# Patient Record
Sex: Male | Born: 1945 | Race: White | Hispanic: No | Marital: Married | Smoking: Current every day smoker
Health system: Southern US, Community
[De-identification: ages and names within clinical notes are randomized; demographics above are authoritative.]

## PROBLEM LIST (undated history)

## (undated) DIAGNOSIS — E079 Disorder of thyroid, unspecified: Secondary | ICD-10-CM

## (undated) DIAGNOSIS — I10 Essential (primary) hypertension: Secondary | ICD-10-CM

## (undated) DIAGNOSIS — E785 Hyperlipidemia, unspecified: Secondary | ICD-10-CM

## (undated) DIAGNOSIS — K219 Gastro-esophageal reflux disease without esophagitis: Secondary | ICD-10-CM

## (undated) HISTORY — DX: Hyperlipidemia, unspecified: E78.5

## (undated) HISTORY — DX: Gastro-esophageal reflux disease without esophagitis: K21.9

## (undated) HISTORY — DX: Essential (primary) hypertension: I10

## (undated) HISTORY — DX: Disorder of thyroid, unspecified: E07.9

---

## 1998-04-15 ENCOUNTER — Ambulatory Visit (HOSPITAL_COMMUNITY): Admission: RE | Admit: 1998-04-15 | Discharge: 1998-04-15 | Payer: Self-pay | Admitting: Internal Medicine

## 1998-05-17 ENCOUNTER — Ambulatory Visit (HOSPITAL_COMMUNITY): Admission: RE | Admit: 1998-05-17 | Discharge: 1998-05-17 | Payer: Self-pay | Admitting: Internal Medicine

## 1998-10-26 ENCOUNTER — Encounter: Payer: Self-pay | Admitting: Internal Medicine

## 1998-10-26 ENCOUNTER — Ambulatory Visit (HOSPITAL_COMMUNITY): Admission: RE | Admit: 1998-10-26 | Discharge: 1998-10-26 | Payer: Self-pay | Admitting: Internal Medicine

## 1999-05-03 ENCOUNTER — Ambulatory Visit: Admission: AD | Admit: 1999-05-03 | Discharge: 1999-05-03 | Payer: Self-pay | Admitting: Otolaryngology

## 2005-06-19 ENCOUNTER — Other Ambulatory Visit: Admission: RE | Admit: 2005-06-19 | Discharge: 2005-06-19 | Payer: Self-pay | Admitting: Dermatology

## 2009-02-20 ENCOUNTER — Emergency Department (HOSPITAL_COMMUNITY): Admission: EM | Admit: 2009-02-20 | Discharge: 2009-02-20 | Payer: Self-pay | Admitting: Emergency Medicine

## 2009-02-25 ENCOUNTER — Ambulatory Visit (HOSPITAL_COMMUNITY): Admission: RE | Admit: 2009-02-25 | Discharge: 2009-02-25 | Payer: Self-pay | Admitting: Urology

## 2009-04-12 ENCOUNTER — Ambulatory Visit: Payer: Self-pay | Admitting: Family Medicine

## 2009-04-12 DIAGNOSIS — N209 Urinary calculus, unspecified: Secondary | ICD-10-CM

## 2009-04-12 DIAGNOSIS — K219 Gastro-esophageal reflux disease without esophagitis: Secondary | ICD-10-CM

## 2009-04-12 DIAGNOSIS — E039 Hypothyroidism, unspecified: Secondary | ICD-10-CM | POA: Insufficient documentation

## 2009-04-12 DIAGNOSIS — E785 Hyperlipidemia, unspecified: Secondary | ICD-10-CM | POA: Insufficient documentation

## 2009-04-12 DIAGNOSIS — IMO0002 Reserved for concepts with insufficient information to code with codable children: Secondary | ICD-10-CM | POA: Insufficient documentation

## 2009-04-12 DIAGNOSIS — F17200 Nicotine dependence, unspecified, uncomplicated: Secondary | ICD-10-CM

## 2009-04-12 DIAGNOSIS — G4730 Sleep apnea, unspecified: Secondary | ICD-10-CM | POA: Insufficient documentation

## 2009-04-12 DIAGNOSIS — M722 Plantar fascial fibromatosis: Secondary | ICD-10-CM

## 2009-07-20 ENCOUNTER — Telehealth: Payer: Self-pay | Admitting: Family Medicine

## 2009-09-07 ENCOUNTER — Telehealth: Payer: Self-pay | Admitting: Family Medicine

## 2010-01-04 ENCOUNTER — Ambulatory Visit: Payer: Self-pay | Admitting: Family Medicine

## 2010-01-04 LAB — CONVERTED CEMR LAB
ALT: 31 units/L (ref 0–53)
AST: 26 units/L (ref 0–37)
Alkaline Phosphatase: 97 units/L (ref 39–117)
BUN: 14 mg/dL (ref 6–23)
Basophils Absolute: 0 10*3/uL (ref 0.0–0.1)
Bilirubin Urine: NEGATIVE
Bilirubin, Direct: 0.2 mg/dL (ref 0.0–0.3)
Calcium: 9.1 mg/dL (ref 8.4–10.5)
Eosinophils Relative: 3.1 % (ref 0.0–5.0)
GFR calc non Af Amer: 101.48 mL/min (ref >60)
HCT: 46.6 % (ref 39.0–52.0)
HDL: 30.6 mg/dL — ABNORMAL LOW (ref >39.00)
Ketones, urine, test strip: NEGATIVE
LDL Cholesterol: 89 mg/dL (ref 0–99)
Lymphocytes Relative: 32 % (ref 12.0–46.0)
Lymphs Abs: 2.6 10*3/uL (ref 0.7–4.0)
Monocytes Relative: 8.5 % (ref 3.0–12.0)
Neutrophils Relative %: 56 % (ref 43.0–77.0)
Nitrite: NEGATIVE
PSA: 0.19 ng/mL (ref 0.10–4.00)
Platelets: 148 10*3/uL — ABNORMAL LOW (ref 150.0–400.0)
Potassium: 4.2 meq/L (ref 3.5–5.1)
Specific Gravity, Urine: 1.025
Total Bilirubin: 1 mg/dL (ref 0.3–1.2)
Total CHOL/HDL Ratio: 5
VLDL: 32.8 mg/dL (ref 0.0–40.0)
WBC: 8.1 10*3/uL (ref 4.5–10.5)

## 2010-01-11 ENCOUNTER — Ambulatory Visit: Payer: Self-pay | Admitting: Family Medicine

## 2010-09-01 NOTE — Progress Notes (Signed)
Summary: Pt wanted to check to see if info had been rcvd from Dr. Julio Sicks  Phone Note Call from Patient Call back at [phone] cell   Caller: Patient Summary of Call: Pt called and wants to know if pts medical records had been rcvd from Dr. Marney Setting with [facility].     Initial call taken by: Lucy Antigua,  September 07, 2009 10:28 AM  Follow-up for Phone Call        yes.  patient is aware Follow-up by: Kern Reap CMA Duncan Dull),  September 07, 2009 12:56 PM

## 2010-09-01 NOTE — Assessment & Plan Note (Signed)
Summary: cpx/mm   Vital Signs:  Patient profile:   65 year old male Height:      73 inches Weight:      302 pounds BMI:     39.99 Temp:     97.8 degrees F oral BP sitting:   120 / 90  (left arm) Cuff size:   large  Vitals Entered By: Kathrynn Speed CMA (January 11, 2010 8:14 AM) CC: CPX w lab review   CC:  CPX w lab review.  History of Present Illness: Ronald Fowler is a 65 year old, married male, smoker, who comes in today for evaluation of reflux esophagitis, hypertension, hypothyroidism, tobacco abuse, and hyperlipidemia.  He takes omeprazole 20 mg daily for reflux esophagitis.  He takes quinapril 10 mg daily for hypertension.  BP 120/90.  He takes levo thyroxine 100 micrograms daily for hypothyroidism.  TSH is within normal limits.  He takes lovastatin 40 mg daily for hyperlipidemia.  Lipids are at goal...he complains of being tired and having no energy, however, he is over 300 pounds.  He does not exercise on a regular basis and continues to smoke.  He went to see a urologist last fall because of urinary frequency.  They took him off caffeine.  Symptoms went away  He continues to smoke.  We gave him a prescription for chantix last year.  He got it filled, but has not started because his concern about the potential side effects.  I explained to him the potential side effects of the medication, which can be decreased by decreasing the dose or much less than continue to smoke.Here for Medicare AWV:  1.   Risk factors based on Past M, S, F history:...........unchanged 2.   Physical Activities: ...........sedentary 3.   Depression/mood: ............normal 4.   Hearing: .........normal 5.   ADL's: ............normal 6.   Fall Risk:..........Marland Kitchenreviewed  7.   Home Safety...........Marland Kitchenreviewed:  8.   Height, weight, &visual acuity..........................................Marland Kitchenheight and weight unchanged 302 pounds:.......Marland Kitchenexam set up for next week   9.   Counseling: .............advised to begin a  diet, exercise program, and stop smoking 10.   Labs ordered based on risk factors: ...........done 11.           Referral Coordination..........none 12.           Care Plan..................Marland Kitchenreviewed 13.            Cognitive Assessment ............ normal  Current Medications (verified): 1)  Omeprazole 20 Mg Cpdr (Omeprazole) .... Take One Tab Once Daily 2)  Quinapril Hcl 10 Mg Tabs (Quinapril Hcl) .... Take One Tab Once Daily 3)  Levothroid 100 Mcg Tabs (Levothyroxine Sodium) .... Once Daily 4)  Lovastatin 40 Mg Tabs (Lovastatin) .... Take One Tab Once Daily 5)  Chantix Starting Month Pak 0.5 Mg X 11 & 1 Mg X 42 Tabs (Varenicline Tartrate) .... Uad 6)  Chantix Continuing Month Pak 1 Mg Tabs (Varenicline Tartrate) .... Uad  Allergies (verified): No Known Drug Allergies  Past History:  Past medical, surgical, family and social histories (including risk factors) reviewed, and no changes noted (except as noted below).  Past Medical History: Reviewed history from 04/12/2009 and no changes required. sleep apnea heal pain hearing loss mole/ wart removal  Past Surgical History: Reviewed history from 04/12/2009 and no changes required. Denies surgical history  Family History: Reviewed history from 04/12/2009 and no changes required. Father: deceased - prostate cancer Mother: deceased - heart disease MI Siblings: 2 brothers - 1 - bypass surgery  1 - eye oval                4 sisters - 1 - demenia                                  2 - colon cancer  Social History: Reviewed history from 04/12/2009 and no changes required. 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 No apparent alopecia or  balding. Eyes:  No corneal or conjunctival inflammation noted. EOMI. Perrla. Funduscopic exam benign, without hemorrhages, exudates or papilledema. Vision grossly normal. Ears:  External ear exam shows no significant lesions or deformities.  Otoscopic examination reveals clear canals, tympanic membranes are intact bilaterally without bulging, retraction, inflammation or discharge. Hearing is grossly normal bilaterally. Nose:  External nasal examination shows no deformity or inflammation. Nasal mucosa are pink and moist without lesions or exudates. Mouth:  Oral mucosa and oropharynx without lesions or exudates.  Teeth in good repair. Neck:  No deformities, masses, or tenderness noted. Chest Wall:  No deformities, masses, tenderness or gynecomastia noted. Breasts:  No masses or gynecomastia noted Lungs:  Normal respiratory effort, chest expands symmetrically. Lungs are clear to auscultation, no crackles or wheezes. Heart:  Normal rate and regular rhythm. S1 and S2 normal without gallop, murmur, click, rub or other extra sounds. Abdomen:  Bowel sounds positive,abdomen soft and non-tender without masses, organomegaly or hernias noted. Rectal:  No external abnormalities noted. Normal sphincter tone. No rectal masses or tenderness. Genitalia:  Testes bilaterally descended without nodularity, tenderness or masses. No scrotal masses or lesions. No penis lesions or urethral discharge. Prostate:  Prostate gland firm and smooth, no enlargement, nodularity, tenderness, mass, asymmetry or induration. Msk:  No deformity or scoliosis noted of thoracic or lumbar spine.   Pulses:  R and L carotid,radial,femoral,dorsalis pedis and posterior tibial pulses are full and equal bilaterally Extremities:  No clubbing, cyanosis, edema, or deformity noted with normal full range of motion of all joints.   Neurologic:  No cranial nerve deficits noted. Station and gait are normal. Plantar reflexes are down-going bilaterally.  DTRs are symmetrical throughout. Sensory, motor and coordinative functions appear intact. Skin:  Intact without suspicious lesions or rashes Cervical Nodes:  No lymphadenopathy noted Axillary Nodes:  No palpable lymphadenopathy Inguinal Nodes:  No significant adenopathy Psych:  Cognition and judgment appear intact. Alert and cooperative with normal attention span and concentration. No apparent delusions, illusions, hallucinations   Impression & Recommendations:  Problem # 1:  ROUTINE GENERAL MEDICAL EXAM@HEALTH  CARE FACL (ICD-V70.0) Assessment Unchanged  Orders: Prescription Created Electronically [phone]) First annual wellness visit with prevention plan  (B1478)  Problem # 2:  UNSPECIFIED HYPOTHYROIDISM (ICD-244.9) Assessment: Improved  His updated medication list for this problem includes:    Levothroid 100 Mcg Tabs (Levothyroxine sodium) ..... Once daily  Orders: Prescription Created Electronically [phone]) First annual wellness visit with prevention plan  [phone])  Problem # 3:  HYPERTENSION NEC (ICD-997.91) Assessment: Improved  Orders: Prescription Created Electronically [phone]) First annual wellness visit with prevention plan  (N6295) EKG w/ Interpretation (93000)  Problem # 4:  GERD (ICD-530.81) Assessment: Improved  His updated medication list for this problem includes:    Omeprazole 20 Mg Cpdr (Omeprazole) .Marland Kitchen... Take one tab  once daily  Orders: Prescription Created Electronically [phone]) First annual wellness visit with prevention plan  (U0454)  Problem # 5:  TOBACCO USE (ICD-305.1) Assessment: Unchanged  His updated medication list for this problem includes:    Chantix Starting Month Pak 0.5 Mg X 11 & 1 Mg X 42 Tabs (Varenicline tartrate) ..... Uad    Chantix Continuing Month Pak 1 Mg Tabs (Varenicline tartrate) ..... Uad  Orders: Prescription Created Electronically [phone]) First annual wellness visit with prevention plan  (B1478)  Problem # 6:   HYPERLIPIDEMIA (ICD-272.4) Assessment: Improved  His updated medication list for this problem includes:    Lovastatin 40 Mg Tabs (Lovastatin) .Marland Kitchen... Take one tab once daily  Orders: Prescription Created Electronically [phone]) First annual wellness visit with prevention plan  (Z3086) EKG w/ Interpretation (93000)  Complete Medication List: 1)  Omeprazole 20 Mg Cpdr (Omeprazole) .... Take one tab once daily 2)  Quinapril Hcl 10 Mg Tabs (Quinapril hcl) .... Take one tab once daily 3)  Levothroid 100 Mcg Tabs (Levothyroxine sodium) .... Once daily 4)  Lovastatin 40 Mg Tabs (Lovastatin) .... Take one tab once daily 5)  Chantix Starting Month Pak 0.5 Mg X 11 & 1 Mg X 42 Tabs (Varenicline tartrate) .... Uad 6)  Chantix Continuing Month Pak 1 Mg Tabs (Varenicline tartrate) .... Uad  Patient Instructions: 1)  start the chantix program by following the directions in the medication.  Once you get to the blue pills......... take a half a tablet twice daily instead of a full tablet twice daily.  Return in 3 weeks for follow. 2)  In order to feel better he you going to need to start a diet and exercise program.  Decrease her caloric intake to 2000 calories a day.  Avoid all carbohydrates walk 20 minutes daily. 3)  Take an Aspirin every day. Prescriptions: LOVASTATIN 40 MG TABS (LOVASTATIN) take one tab once daily  #100 x 3   Entered and Authorized by:   Roderick Pee MD   Signed by:   Roderick Pee MD on 01/11/2010   Method used:   Electronically to        [facility]* (retail)       [address]       [place], [place]  [address]       Ph: [phone]       Fax: [phone]   RxID:   [id number] LEVOTHROID 100 MCG TABS (LEVOTHYROXINE SODIUM) once daily  #100 x 3   Entered and Authorized by:   Roderick Pee MD   Signed by:   Roderick Pee MD on 01/11/2010   Method used:   Electronically to        [facility]* (retail)       [address]       [place], [place]   [address]       Ph: [phone]       Fax: [phone]   RxID:   [id number] QUINAPRIL HCL 10 MG TABS (QUINAPRIL HCL) take one tab once daily  #100 x 3   Entered and Authorized by:   Roderick Pee MD   Signed by:   Roderick Pee MD on 01/11/2010   Method used:   Electronically to        [facility]* (retail)       [address]       [place], [place]  [address]       Ph: [phone]  Fax: [phone]   RxID:   [id number] OMEPRAZOLE 20 MG CPDR (OMEPRAZOLE) take one tab once daily  #100 x 3   Entered and Authorized by:   Roderick Pee MD   Signed by:   Roderick Pee MD on 01/11/2010   Method used:   Electronically to        [facility]* (retail)       [address]       [place], [place]  [address]       Ph: [phone]       Fax: [phone]   RxID:   [id number]

## 2010-11-06 LAB — URINALYSIS, ROUTINE W REFLEX MICROSCOPIC
Bilirubin Urine: NEGATIVE
Glucose, UA: NEGATIVE mg/dL
Ketones, ur: NEGATIVE mg/dL
Nitrite: NEGATIVE
Protein, ur: 30 mg/dL — AB
Specific Gravity, Urine: 1.026 (ref 1.005–1.030)
Urobilinogen, UA: 1 mg/dL (ref 0.0–1.0)
pH: 6.5 (ref 5.0–8.0)

## 2010-11-06 LAB — URINE MICROSCOPIC-ADD ON

## 2014-07-21 ENCOUNTER — Ambulatory Visit (INDEPENDENT_AMBULATORY_CARE_PROVIDER_SITE_OTHER): Payer: Medicare Other

## 2014-07-21 VITALS — BP 159/93 | HR 59 | Resp 15 | Ht 74.0 in | Wt 298.0 lb

## 2014-07-21 DIAGNOSIS — L309 Dermatitis, unspecified: Secondary | ICD-10-CM

## 2014-07-21 DIAGNOSIS — B353 Tinea pedis: Secondary | ICD-10-CM

## 2014-07-21 MED ORDER — NAFTIFINE HCL 2 % EX CREA: TOPICAL_CREAM | CUTANEOUS | Status: DC

## 2014-07-21 NOTE — Patient Instructions (Signed)
Athlete's Foot Athlete's foot (tinea pedis) is a fungal infection of the skin on the feet. It often occurs on the skin between the toes or underneath the toes. It can also occur on the soles of the feet. Athlete's foot is more likely to occur in hot, humid weather. Not washing your feet or changing your socks often enough can contribute to athlete's foot. The infection can spread from person to person (contagious). CAUSES Athlete's foot is caused by a fungus. This fungus thrives in warm, moist places. Most people get athlete's foot by sharing shower stalls, towels, and wet floors with an infected person. People with weakened immune systems, including those with diabetes, may be more likely to get athlete's foot. SYMPTOMS   Itchy areas between the toes or on the soles of the feet.  White, flaky, or scaly areas between the toes or on the soles of the feet.  Tiny, intensely itchy blisters between the toes or on the soles of the feet.  Tiny cuts on the skin. These cuts can develop a bacterial infection.  Thick or discolored toenails. DIAGNOSIS  Your caregiver can usually tell what the problem is by doing a physical exam. Your caregiver may also take a skin sample from the rash area. The skin sample may be examined under a microscope, or it may be tested to see if fungus will grow in the sample. A sample may also be taken from your toenail for testing. TREATMENT  Over-the-counter and prescription medicines can be used to kill the fungus. These medicines are available as powders or creams. Your caregiver can suggest medicines for you. Fungal infections respond slowly to treatment. You may need to continue using your medicine for several weeks. PREVENTION   Do not share towels.  Wear sandals in wet areas, such as shared locker rooms and shared showers.  Keep your feet dry. Wear shoes that allow air to circulate. Wear cotton or wool socks. HOME CARE INSTRUCTIONS   Take medicines as directed by  your caregiver. Do not use steroid creams on athlete's foot.  Keep your feet clean and cool. Wash your feet daily and dry them thoroughly, especially between your toes.  Change your socks every day. Wear cotton or wool socks. In hot climates, you may need to change your socks 2 to 3 times per day.  Wear sandals or canvas tennis shoes with good air circulation.  If you have blisters, soak your feet in Burow's solution or Epsom salts for 20 to 30 minutes, 2 times a day to dry out the blisters. Make sure you dry your feet thoroughly afterward. SEEK MEDICAL CARE IF:   You have a fever.  You have swelling, soreness, warmth, or redness in your foot.  You are not getting better after 7 days of treatment.  You are not completely cured after 30 days.  You have any problems caused by your medicines. MAKE SURE YOU:   Understand these instructions.  Will watch your condition.  Will get help right away if you are not doing well or get worse. Document Released: 07/14/2000 Document Revised: 10/09/2011 Document Reviewed: 05/05/2011 [facility] Patient Information 2015 [employer] [place]. This information is not intended to replace advice given to you by your health care provider. Make sure you discuss any questions you have with your health care provider.   Applied both steroid and antifungal creams twice daily to the affected area for 6 week duration as instructed

## 2014-07-21 NOTE — Progress Notes (Signed)
   Subjective:    Patient ID: Ronald Fowler, male    DOB: 05/05/1946, 68 y.o.   MRN: [id number]  HPI Comments: N rash L left medial ankle D 6 - 9 months O worsening C red speckled, itchy, and peeling rash A  T Dermatologist prescribed Clobetasol Propionate .05%, and Nystatin Cream     Review of Systems  HENT: Positive for congestion and sinus pressure.   Musculoskeletal: Positive for back pain.  All other systems reviewed and are negative.      Objective:   Physical Exam Lower extremity objective findings reveal palpable pedal pulses DP +2 PT plus one over 4 bilateral Refill time 3 seconds all digits epicritic and proprioceptive sensations appear to be intact and symmetric there is no plantar response DTRs not listed dermatologically skin color pigment normal hair growth absent there is dry scaling skin and there is a macular rash medial nail lateral malleoli areas of the left ankle region been there for some time patient been trying to treat topically. Using Nizoral cream and clobetasol cream. However has not been doing this consistently only applying once a day and only using one of the creams at a time. Patient is advised needs to use both steroid and antifungal creams twice daily to the affected area as instructed for 6 weeks should note remainder the exam otherwise unremarkable no open wounds no ulcers no secondary infections her Riddick rashes identified       Assessment & Plan:  Assessment chronic eczema versus chronic tinea pedis or ringworm on the medial lateral malleoli areas patient is advised in topical treatments twice daily a [facility]in [facility]. Patient will applied both the clobetasol and Naftin cream twice daily to the affected areas for a 6 week duration Gray shoes and socks with topical antifungal or spray antifungal such as Tinactin or Desenex recheck in 2 months if not resolved  Alvan Dameichard Khalil Szczepanik DPM

## 2014-09-01 ENCOUNTER — Ambulatory Visit: Payer: No Typology Code available for payment source

## 2014-09-08 ENCOUNTER — Telehealth: Payer: Self-pay | Admitting: *Deleted

## 2014-09-08 MED ORDER — NAFTIFINE HCL 2 % EX CREA: TOPICAL_CREAM | CUTANEOUS | Status: AC

## 2014-09-08 NOTE — Telephone Encounter (Signed)
Pt state [facility] called and the Naftin would be $700.00, he has something the dermatologist prescribed and he'll continue to use it the problem is about gone anyway.

## 2014-09-08 NOTE — Telephone Encounter (Signed)
Pt request refill of Naftin.  Dr. Jake SeatsSikora okayed 2 refills of Naftin.

## 2014-09-15 ENCOUNTER — Ambulatory Visit: Payer: No Typology Code available for payment source

## 2015-01-25 ENCOUNTER — Other Ambulatory Visit: Payer: Self-pay

## 2018-07-16 ENCOUNTER — Other Ambulatory Visit (HOSPITAL_COMMUNITY): Payer: Self-pay | Admitting: Physician Assistant

## 2018-07-16 DIAGNOSIS — M25562 Pain in left knee: Secondary | ICD-10-CM

## 2018-07-16 DIAGNOSIS — M545 Low back pain, unspecified: Secondary | ICD-10-CM

## 2018-07-29 ENCOUNTER — Ambulatory Visit (HOSPITAL_COMMUNITY): Payer: Self-pay

## 2018-07-30 ENCOUNTER — Other Ambulatory Visit: Payer: Self-pay | Admitting: Physician Assistant

## 2018-07-30 ENCOUNTER — Other Ambulatory Visit (HOSPITAL_COMMUNITY): Payer: Self-pay | Admitting: Physician Assistant

## 2018-07-30 DIAGNOSIS — M545 Low back pain, unspecified: Secondary | ICD-10-CM

## 2018-08-05 ENCOUNTER — Ambulatory Visit (HOSPITAL_COMMUNITY)
Admission: RE | Admit: 2018-08-05 | Discharge: 2018-08-05 | Disposition: A | Discharge status: Home / Self Care | Payer: No Typology Code available for payment source | Source: Ambulatory Visit | Attending: Physician Assistant | Admitting: Physician Assistant

## 2018-08-05 DIAGNOSIS — M545 Low back pain, unspecified: Secondary | ICD-10-CM

## 2018-08-05 DIAGNOSIS — M25562 Pain in left knee: Secondary | ICD-10-CM | POA: Diagnosis present

## 2019-07-21 IMAGING — MR MR LUMBAR SPINE W/O CM
4 of 5 series · 19 of 48 positions shown · non-contrast
Comparison: CT abdomen pelvis dated August 25, 2015.

CLINICAL DATA: Chronic low back pain radiating into the left knee.

EXAM:
MRI LUMBAR SPINE WITHOUT CONTRAST
TECHNIQUE: Multiplanar, multisequence MR imaging of the lumbar spine was
performed. No intravenous contrast was administered.

[Series 3: T1 · sagittal · 4.0mm · 0.51mm/px · 3 of 14 slices shown (1 of 2)]
[im 3/14]
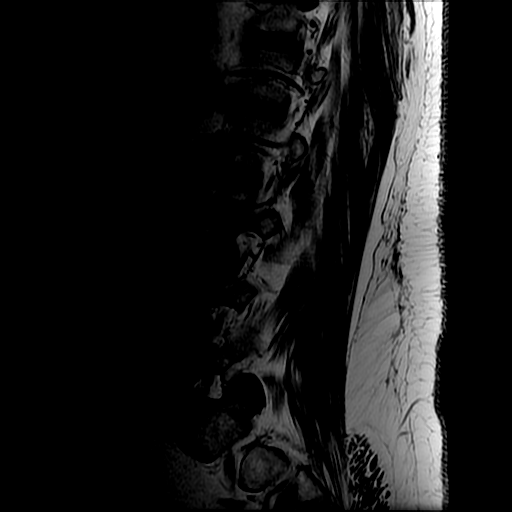
[im 8/14]
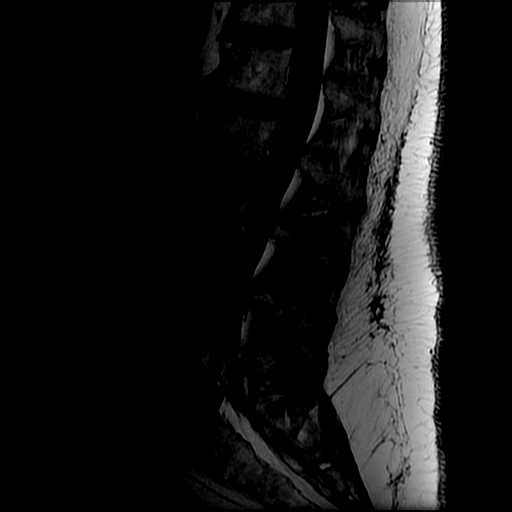
[im 14/14]
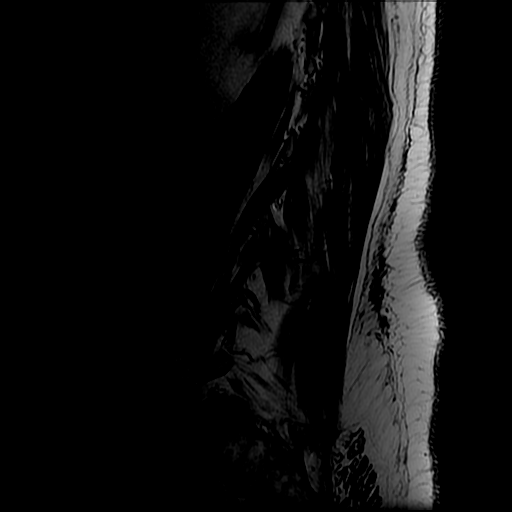

[Series 4: T2 post-contrast · sagittal · 4.0mm · 0.51mm/px · 5 of 14 slices shown]
[im 1/14]
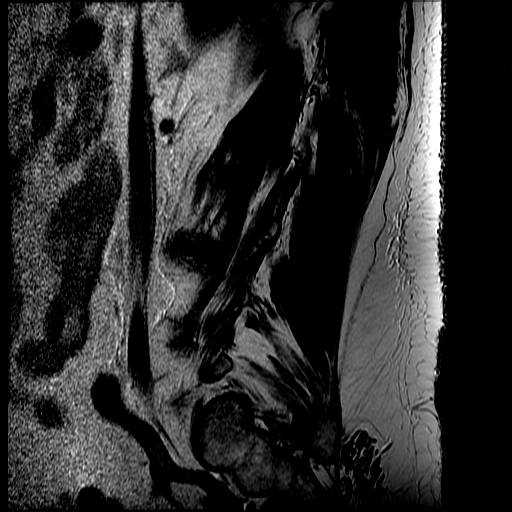
[im 4/14]
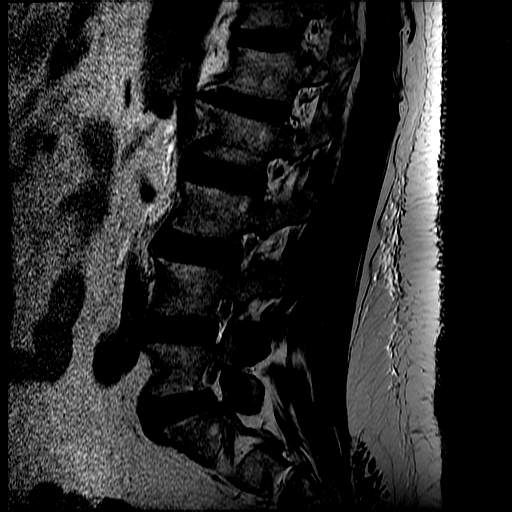
[im 7/14]
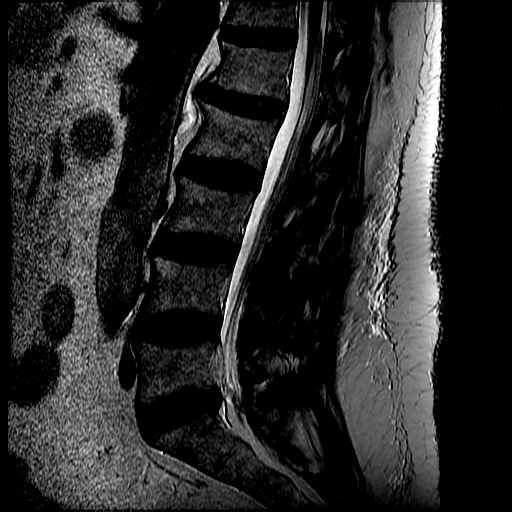
[im 10/14]
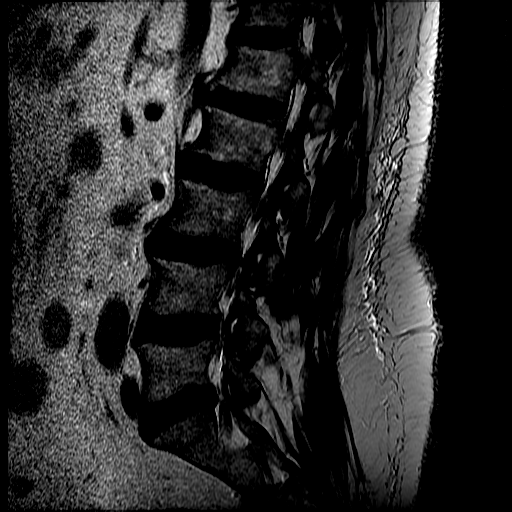
[im 14/14]
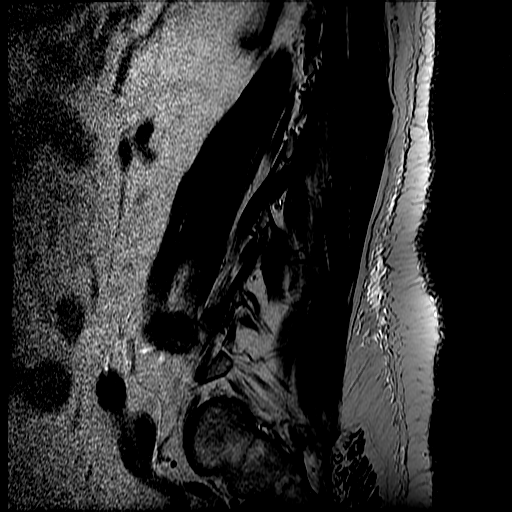

[Series 6: T2 · axial · 4.0mm · 0.39mm/px · z∈[-76,+105]mm · 8 of 46 slices shown]
[im 4/46]
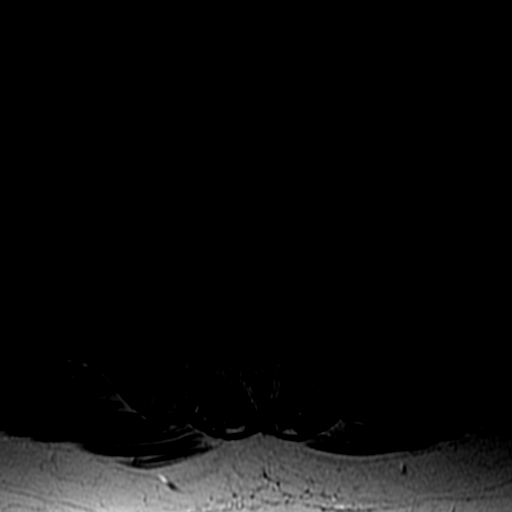
[im 7/46]
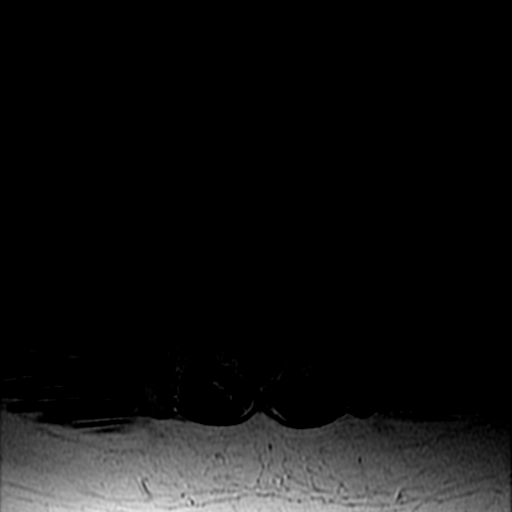
[im 10/46]
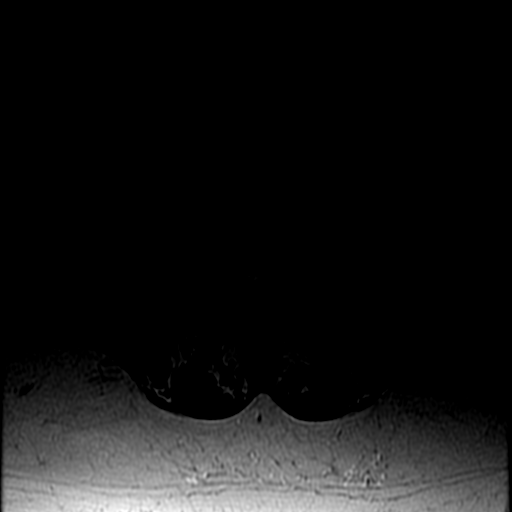
[im 16/46]
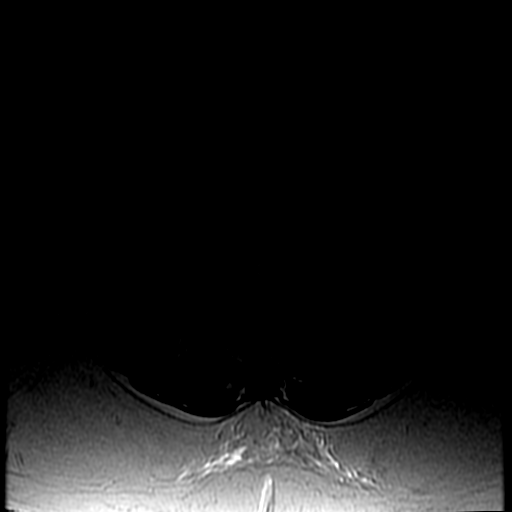
[im 22/46]
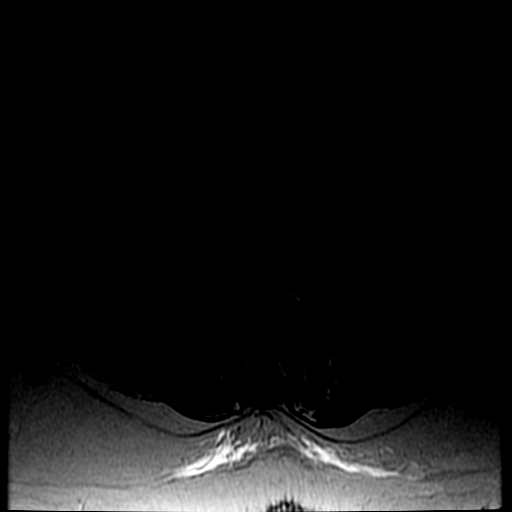
[im 25/46]
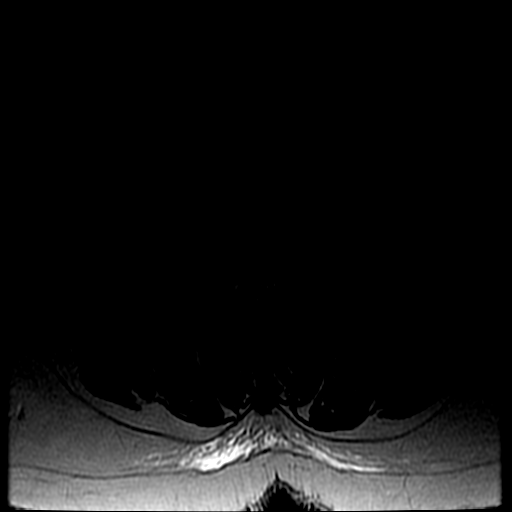
[im 28/46]
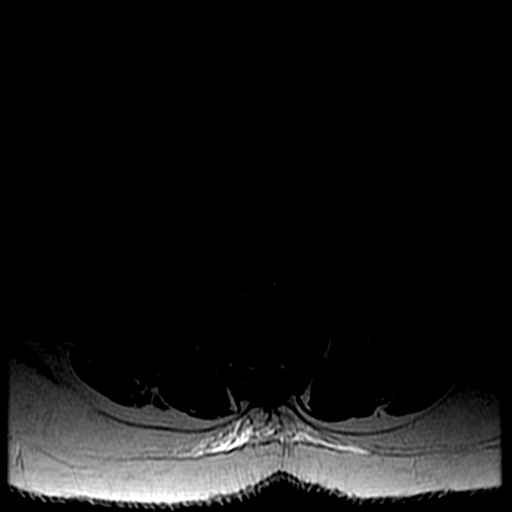
[im 40/46]
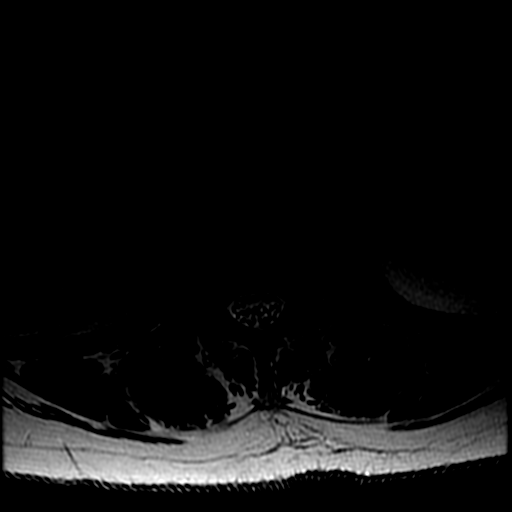

[Series 7: T1 · axial · 4.0mm · 0.39mm/px · z∈[-61,+105]mm · 3 of 46 slices shown (2 of 2)]
[im 7/46]
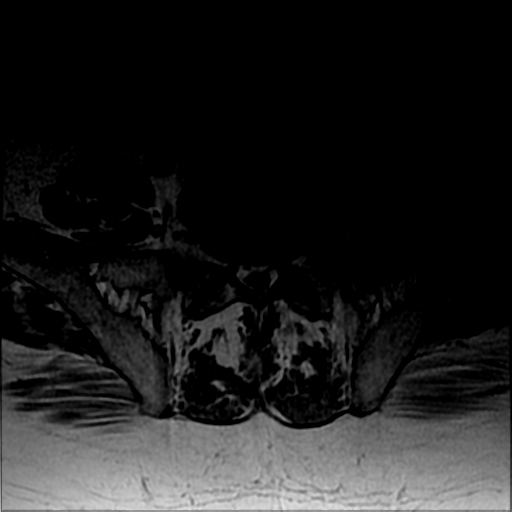
[im 25/46]
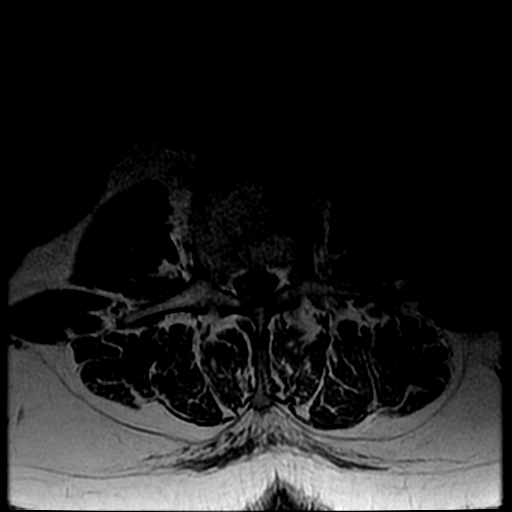
[im 40/46]
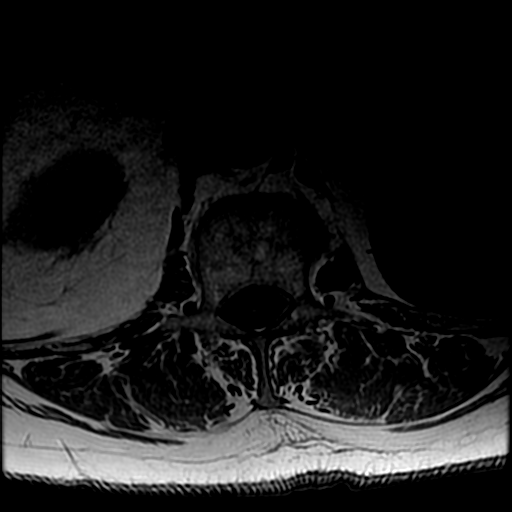

[19 of 48 positions shown; findings below may reference images not displayed]

FINDINGS: Segmentation:  Standard.

Alignment:  Physiologic.

Vertebrae:  No fracture, evidence of discitis, or bone lesion.

Conus medullaris and cauda equina: Conus extends to the L1 level.
Conus and cauda equina appear normal.

Paraspinal and other soft tissues: Partially visualized large
bilateral renal cysts. 1.6 cm simple cyst in the inferior liver.

Disc levels:

T12-L1: Left far lateral disc osteophyte complex. Mild bilateral
facet arthropathy. No stenosis.

L1-L2:  Anterior disc osteophyte complex.  No stenosis.

L2-L3:  Minimal disc bulging.  No stenosis.

L3-L4: Minimal disc bulging. Mild bilateral facet arthropathy. No
stenosis.

L4-L5: Minimal disc bulging. Mild bilateral facet arthropathy. Mmild
to moderate left lateral recess and mild left neuroforaminal
stenosis. No spinal canal or right neuroforaminal stenosis.

L5-S1:  Shallow broad-based disc protrusion.  No stenosis.
IMPRESSION: 1. Mild multilevel degenerative disc disease throughout the lumbar
spine as described above. Mild to moderate left lateral recess
stenosis at L4-L5 could affect the descending left L5 nerve root and
account for the patient's symptoms.
